# Patient Record
Sex: Male | Born: 1995 | Race: Black or African American | Hispanic: No | Marital: Single | Smoking: Current every day smoker
Health system: Southern US, Community
[De-identification: ages and names within clinical notes are randomized; demographics above are authoritative.]

## PROBLEM LIST (undated history)

## (undated) DIAGNOSIS — J45909 Unspecified asthma, uncomplicated: Secondary | ICD-10-CM

---
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
## 2017-08-16 ENCOUNTER — Encounter (HOSPITAL_COMMUNITY): Payer: Self-pay | Admitting: Emergency Medicine

## 2017-08-16 ENCOUNTER — Other Ambulatory Visit: Payer: Self-pay

## 2017-08-16 ENCOUNTER — Ambulatory Visit (HOSPITAL_COMMUNITY)
Admission: EM | Admit: 2017-08-16 | Discharge: 2017-08-16 | Disposition: A | Discharge status: Home / Self Care | Payer: Self-pay | Attending: Family Medicine | Admitting: Family Medicine

## 2017-08-16 DIAGNOSIS — A64 Unspecified sexually transmitted disease: Secondary | ICD-10-CM

## 2017-08-16 MED ORDER — CEFTRIAXONE SODIUM 250 MG IJ SOLR
INTRAMUSCULAR | Status: AC
  Filled 2017-08-16: qty 250

## 2017-08-16 MED ORDER — STERILE WATER FOR INJECTION IJ SOLN
INTRAMUSCULAR | Status: AC
Start: 2017-08-16 — End: ?
  Filled 2017-08-16: qty 10

## 2017-08-16 MED ORDER — AZITHROMYCIN 250 MG PO TABS
ORAL_TABLET | ORAL | Status: AC
  Filled 2017-08-16: qty 4

## 2017-08-16 MED ORDER — AZITHROMYCIN 250 MG PO TABS
1000.0000 mg | ORAL_TABLET | Freq: Once | ORAL | Status: AC
  Administered 2017-08-16: 1000 mg via ORAL

## 2017-08-16 MED ORDER — CEFTRIAXONE SODIUM 250 MG IJ SOLR
250.0000 mg | Freq: Once | INTRAMUSCULAR | Status: AC
  Administered 2017-08-16: 250 mg via INTRAMUSCULAR

## 2017-08-16 NOTE — ED Provider Notes (Signed)
[facility]    CSN: [id number] Arrival date & time: 08/16/17  1823     History   Chief Complaint Chief Complaint  Patient presents with  . SEXUALLY TRANSMITTED DISEASE    HPI ELOY FEHL is a 22 y.o. male.   HPI  Patient went to the health department for STD check.  He was found to have chlamydia and gonorrhea.  He did not have trichomonas HIV or syphilis.  He took the azithromycin for the chlamydia.  He did not go back for his appointment at the health department to get a shot for gonorrhea.  He is here with his girlfriend to make sure he gets his shot today.  Because it has been 7 days since his last azithromycin, and get a repeat both the Rocephin and azithromycin. We talked about safe sex. He is asymptomatic.  Past Medical History:  Diagnosis Date  . Asthma    grew out of - no exacerbations since age 68    There are no active problems to display for this patient.   History reviewed. No pertinent surgical history.     Home Medications    Prior to Admission medications   Medication Sig Start Date End Date Taking? Authorizing Provider  ibuprofen (ADVIL,MOTRIN) 600 MG tablet Take 1 tablet (600 mg total) by mouth every 6 (six) hours as needed for pain. 08/11/12   Marcellina Millin, MD    Family History Family History  Problem Relation Age of Onset  . Healthy Father     Social History Social History   Tobacco Use  . Smoking status: Never Smoker  . Smokeless tobacco: Never Used  Substance Use Topics  . Alcohol use: No  . Drug use: No     Allergies   Patient has no known allergies.   Review of Systems Review of Systems  Constitutional: Negative for chills and fever.  HENT: Negative for ear pain and sore throat.   Eyes: Negative for pain and visual disturbance.  Respiratory: Negative for cough and shortness of breath.   Cardiovascular: Negative for chest pain and palpitations.  Gastrointestinal: Negative for abdominal pain and vomiting.    Genitourinary: Negative for dysuria and hematuria.  Musculoskeletal: Negative for arthralgias and back pain.  Skin: Negative for color change and rash.  Neurological: Negative for seizures and syncope.  All other systems reviewed and are negative.    Physical Exam Triage Vital Signs ED Triage Vitals  Enc Vitals Group     BP 08/16/17 1900 115/78     Pulse Rate 08/16/17 1900 73     Resp 08/16/17 1900 18     Temp 08/16/17 1900 98.3 F (36.8 C)     Temp Source 08/16/17 1900 Oral     SpO2 08/16/17 1900 100 %     Weight --      Height --      Head Circumference --      Peak Flow --      Pain Score 08/16/17 1858 0     Pain Loc --      Pain Edu? --      Excl. in GC? --    No data found.  Updated Vital Signs BP 115/78 (BP Location: Left Arm)   Pulse 73   Temp 98.3 F (36.8 C) (Oral)   Resp 18   SpO2 100%   Visual Acuity Right Eye Distance:   Left Eye Distance:   Bilateral Distance:    Right Eye Near:  Left Eye Near:    Bilateral Near:     Physical Exam  Constitutional: He appears well-developed and well-nourished. No distress.  HENT:  Head: Normocephalic and atraumatic.  Mouth/Throat: Oropharynx is clear and moist.  Eyes: Pupils are equal, round, and reactive to light. Conjunctivae are normal.  Neck: Normal range of motion.  Cardiovascular: Normal rate.  Pulmonary/Chest: Effort normal. No respiratory distress.  Abdominal: Soft. He exhibits no distension.  Musculoskeletal: Normal range of motion. He exhibits no edema.  Neurological: He is alert.  Skin: Skin is warm and dry.     UC Treatments / Results  Labs (all labs ordered are listed, but only abnormal results are displayed) Labs Reviewed - No data to display  EKG None  Radiology No results found.  Procedures Procedures (including critical care time)  Medications Ordered in UC Medications  cefTRIAXone (ROCEPHIN) injection 250 mg (has no administration in time range)  azithromycin (ZITHROMAX)  tablet 1,000 mg (has no administration in time range)    Initial Impression / Assessment and Plan / UC Course  I have reviewed the triage vital signs and the nursing notes.  Pertinent labs & imaging results that were available during my care of the patient were reviewed by me and considered in my medical decision making (see chart for details).      Final Clinical Impressions(s) / UC Diagnoses   Final diagnoses:  STD (male)     Discharge Instructions     Return as needed Refrain from sexual relations for 7 days after treatment   ED Prescriptions    None     Controlled Substance Prescriptions [place] Controlled Substance Registry consulted? Not Applicable   Eustace MooreNelson, Philana Younis Sue, MD 08/16/17 [phone]

## 2017-08-16 NOTE — ED Triage Notes (Signed)
Patient denies any symptoms.  Patient has been told he has chlamydia, then was called and told he had gonorrhea.  Patient has missed treatment appts x 2 -health department.
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
## 2018-05-06 ENCOUNTER — Encounter (HOSPITAL_COMMUNITY): Payer: Self-pay | Admitting: Emergency Medicine

## 2018-05-06 ENCOUNTER — Emergency Department (HOSPITAL_COMMUNITY)
Admission: EM | Admit: 2018-05-06 | Discharge: 2018-05-06 | Disposition: A | Discharge status: Home / Self Care | Payer: Self-pay | Attending: Emergency Medicine | Admitting: Emergency Medicine

## 2018-05-06 ENCOUNTER — Emergency Department (HOSPITAL_COMMUNITY): Payer: Self-pay

## 2018-05-06 DIAGNOSIS — Y92810 Car as the place of occurrence of the external cause: Secondary | ICD-10-CM | POA: Insufficient documentation

## 2018-05-06 DIAGNOSIS — X749XXA Intentional self-harm by unspecified firearm discharge, initial encounter: Secondary | ICD-10-CM

## 2018-05-06 DIAGNOSIS — Y999 Unspecified external cause status: Secondary | ICD-10-CM | POA: Insufficient documentation

## 2018-05-06 DIAGNOSIS — W3400XA Accidental discharge from unspecified firearms or gun, initial encounter: Secondary | ICD-10-CM

## 2018-05-06 DIAGNOSIS — Y249XXA Unspecified firearm discharge, undetermined intent, initial encounter: Secondary | ICD-10-CM | POA: Insufficient documentation

## 2018-05-06 DIAGNOSIS — Y939 Activity, unspecified: Secondary | ICD-10-CM | POA: Insufficient documentation

## 2018-05-06 DIAGNOSIS — F17210 Nicotine dependence, cigarettes, uncomplicated: Secondary | ICD-10-CM | POA: Insufficient documentation

## 2018-05-06 DIAGNOSIS — S81801A Unspecified open wound, right lower leg, initial encounter: Secondary | ICD-10-CM | POA: Insufficient documentation

## 2018-05-06 LAB — CBC WITH DIFFERENTIAL/PLATELET
Abs Immature Granulocytes: 0.01 10*3/uL (ref 0.00–0.07)
Basophils Absolute: 0 10*3/uL (ref 0.0–0.1)
Basophils Relative: 1 %
EOS ABS: 0.3 10*3/uL (ref 0.0–0.5)
Eosinophils Relative: 5 %
HCT: 43.6 % (ref 39.0–52.0)
Hemoglobin: 14.2 g/dL (ref 13.0–17.0)
Immature Granulocytes: 0 %
Lymphocytes Relative: 42 %
Lymphs Abs: 2.6 10*3/uL (ref 0.7–4.0)
MCH: 29.6 pg (ref 26.0–34.0)
MCHC: 32.6 g/dL (ref 30.0–36.0)
MCV: 91 fL (ref 80.0–100.0)
Monocytes Absolute: 0.9 10*3/uL (ref 0.1–1.0)
Monocytes Relative: 15 %
Neutro Abs: 2.3 10*3/uL (ref 1.7–7.7)
Neutrophils Relative %: 37 %
Platelets: 166 10*3/uL (ref 150–400)
RBC: 4.79 MIL/uL (ref 4.22–5.81)
RDW: 12.7 % (ref 11.5–15.5)
WBC: 6.1 10*3/uL (ref 4.0–10.5)
nRBC: 0 % (ref 0.0–0.2)

## 2018-05-06 LAB — BPAM RBC
Blood Product Expiration Date: 202003242359
Blood Product Expiration Date: 202003242359
ISSUE DATE / TIME: 202003140507
ISSUE DATE / TIME: 202003140507
Unit Type and Rh: 9500
Unit Type and Rh: 9500

## 2018-05-06 LAB — TYPE AND SCREEN
ABO/RH(D): B POS
Antibody Screen: NEGATIVE
Unit division: 0
Unit division: 0

## 2018-05-06 LAB — BASIC METABOLIC PANEL
Anion gap: 11 (ref 5–15)
BUN: 10 mg/dL (ref 6–20)
CALCIUM: 9.2 mg/dL (ref 8.9–10.3)
CO2: 25 mmol/L (ref 22–32)
Chloride: 101 mmol/L (ref 98–111)
Creatinine, Ser: 1.3 mg/dL — ABNORMAL HIGH (ref 0.61–1.24)
GFR calc Af Amer: >60 mL/min (ref >60)
GFR calc non Af Amer: >60 mL/min (ref >60)
GLUCOSE: 123 mg/dL — AB (ref 70–99)
Potassium: 3.2 mmol/L — ABNORMAL LOW (ref 3.5–5.1)
Sodium: 137 mmol/L (ref 135–145)

## 2018-05-06 LAB — PREPARE FRESH FROZEN PLASMA
Unit division: 0
Unit division: 0

## 2018-05-06 LAB — BPAM FFP
Blood Product Expiration Date: 202003182359
Blood Product Expiration Date: 202003182359
ISSUE DATE / TIME: 202003140507
ISSUE DATE / TIME: 202003140507
UNIT TYPE AND RH: 600
Unit Type and Rh: 6200

## 2018-05-06 LAB — ABO/RH: ABO/RH(D): B POS

## 2018-05-06 MED ORDER — ACETAMINOPHEN 500 MG PO TABS: 1000.0000 mg | ORAL_TABLET | Freq: Three times a day (TID) | ORAL | 0 refills | Status: AC
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
## 2018-05-06 NOTE — ED Notes (Signed)
Pt is sleeping soundly- will arouse with verbal stimuli-explained to pt that he is being discharged, given his phone to call for a ride.
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
## 2018-05-08 ENCOUNTER — Encounter (HOSPITAL_COMMUNITY): Payer: Self-pay | Admitting: Emergency Medicine

## 2019-03-02 ENCOUNTER — Ambulatory Visit: Payer: Medicaid Other | Attending: Internal Medicine

## 2019-03-06 ENCOUNTER — Ambulatory Visit: Payer: Medicaid Other | Attending: Internal Medicine

## 2019-03-07 ENCOUNTER — Ambulatory Visit: Payer: Medicaid Other | Attending: Internal Medicine

## 2019-03-07 DIAGNOSIS — Z20822 Contact with and (suspected) exposure to covid-19: Secondary | ICD-10-CM

## 2019-03-09 LAB — NOVEL CORONAVIRUS, NAA: SARS-CoV-2, NAA: NOT DETECTED

## 2021-01-05 ENCOUNTER — Emergency Department (HOSPITAL_COMMUNITY)
Admission: EM | Admit: 2021-01-05 | Discharge: 2021-01-06 | Disposition: A | Discharge status: Home / Self Care | Attending: Emergency Medicine | Admitting: Emergency Medicine

## 2021-01-05 ENCOUNTER — Emergency Department (HOSPITAL_COMMUNITY)

## 2021-01-05 DIAGNOSIS — S82831A Other fracture of upper and lower end of right fibula, initial encounter for closed fracture: Secondary | ICD-10-CM

## 2021-01-05 DIAGNOSIS — S99911A Unspecified injury of right ankle, initial encounter: Secondary | ICD-10-CM | POA: Diagnosis present

## 2021-01-05 DIAGNOSIS — J45909 Unspecified asthma, uncomplicated: Secondary | ICD-10-CM | POA: Insufficient documentation

## 2021-01-05 DIAGNOSIS — F17200 Nicotine dependence, unspecified, uncomplicated: Secondary | ICD-10-CM | POA: Diagnosis not present

## 2021-01-05 MED ORDER — OXYCODONE-ACETAMINOPHEN 5-325 MG PO TABS
1.0000 | ORAL_TABLET | Freq: Once | ORAL | Status: AC
  Administered 2021-01-05: 1 via ORAL
  Filled 2021-01-05: qty 1
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
## 2021-01-06 MED ORDER — IBUPROFEN 800 MG PO TABS
800.0000 mg | ORAL_TABLET | Freq: Once | ORAL | Status: AC
  Administered 2021-01-06: 800 mg via ORAL
  Filled 2021-01-06: qty 1

## 2021-01-06 MED ORDER — IBUPROFEN 800 MG PO TABS: 800.0000 mg | ORAL_TABLET | Freq: Three times a day (TID) | ORAL | 0 refills | Status: AC

## 2021-01-06 NOTE — Discharge Instructions (Addendum)
You have a distal fibula fracture.  Please wear the walking boot for support.  Take 800 mg ibuprofen 3 times daily.  Take it with food and water as this 3 uses the chance of getting ulcers.  Follow-up with Dr. Cecilie Lowers in 2 weeks.

## 2021-01-06 NOTE — Progress Notes (Signed)
Orthopedic Tech Progress Note Patient Details:  Alex Henderson 1995/07/08 [id number]  Ortho Devices Type of Ortho Device: CAM walker Ortho Device/Splint Location: RLE Ortho Device/Splint Interventions: Ordered, Application, Adjustment   Post Interventions Patient Tolerated: Well Instructions Provided: Care of device  [name] 01/06/2021, 8:31 AM
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
## 2021-01-06 NOTE — ED Notes (Signed)
Pt verbalized understanding of discharge instructions; opportunity for questions provided °

## 2023-07-10 IMAGING — CR DG ANKLE 2V *R*
2 series · 2 of 2 positions shown · non-contrast
Comparison: None.

CLINICAL DATA: Injury.

EXAM:
RIGHT ANKLE - 2 VIEW

[ankle ap]
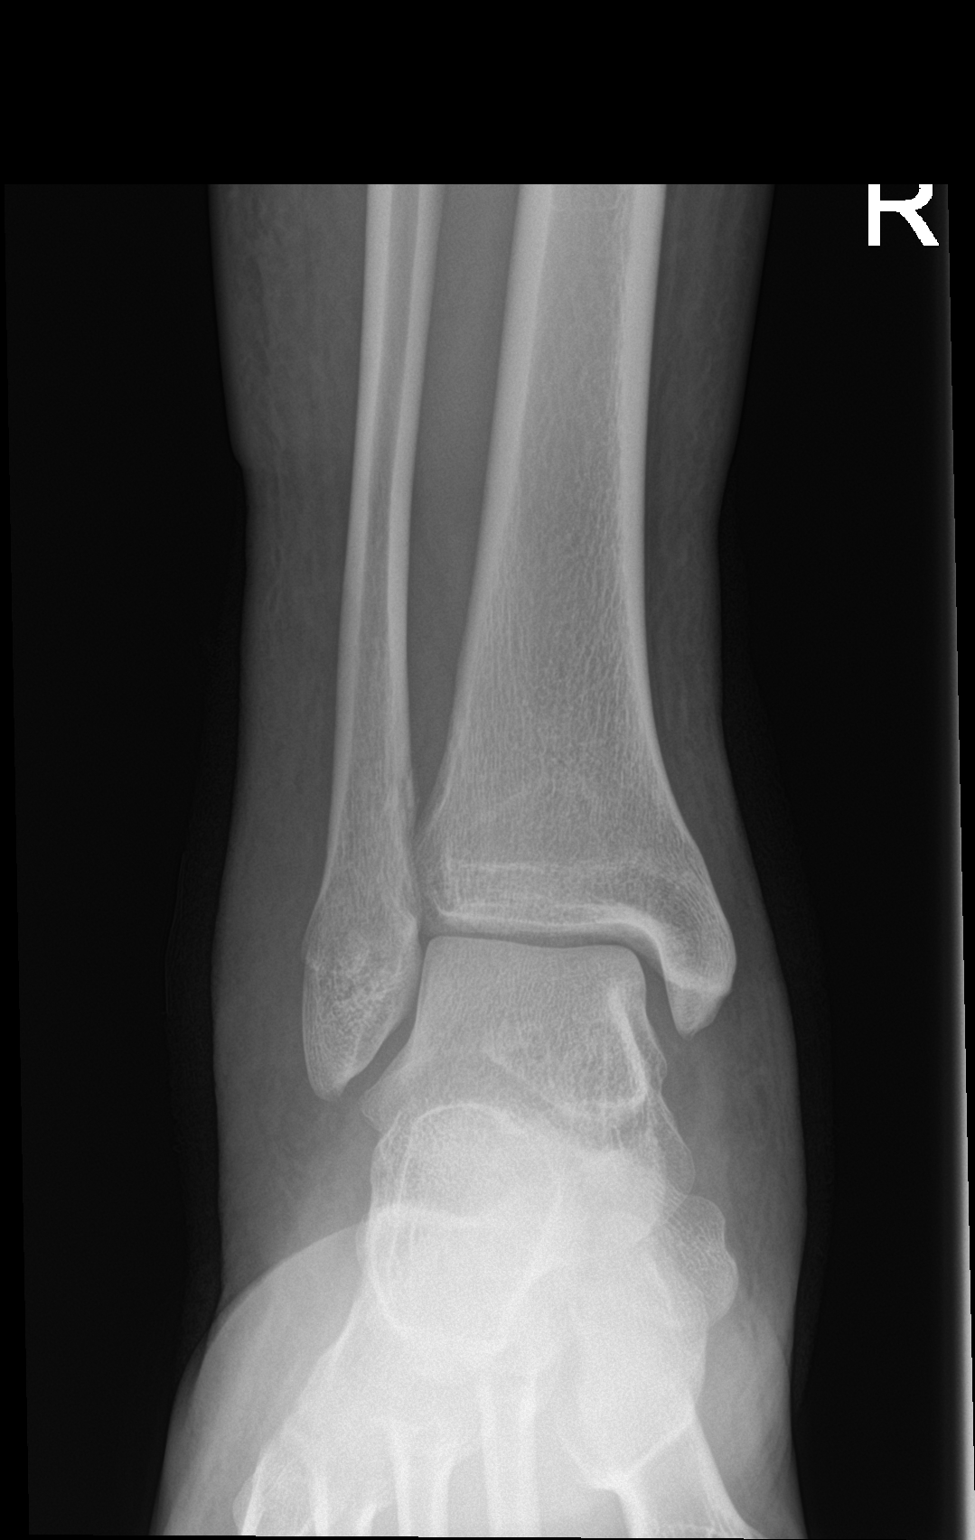

[ankle lat]
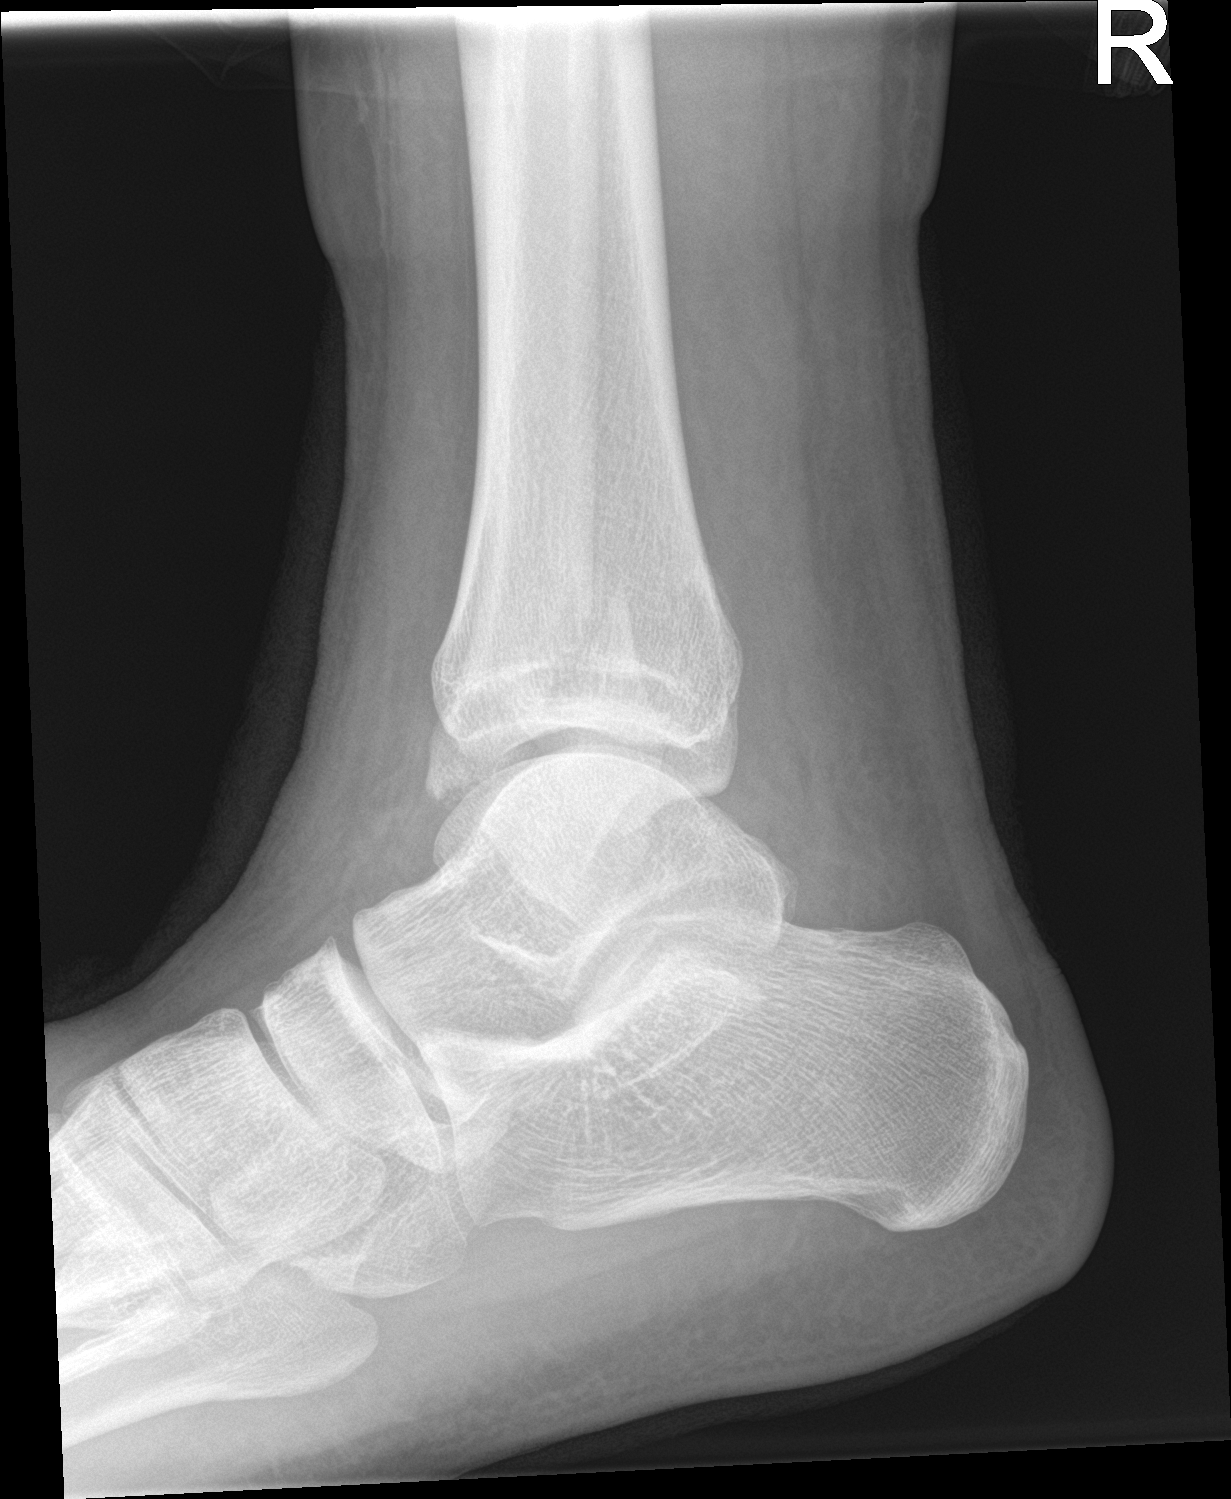

[2 of 2 positions shown; findings below may reference images not displayed]

FINDINGS: There is an acute oblique fracture through the lateral malleolus.
Fracture fragments are distracted approximately 4 mm. There is no
dislocation. There is diffuse soft tissue swelling of the ankle.
IMPRESSION: 1. Acute fracture of the distal fibula.
